# Patient Record
Sex: Female | Born: 1955 | Hispanic: No | Marital: Married | Smoking: Never smoker
Health system: Southern US, Community
[De-identification: ages and names within clinical notes are randomized; demographics above are authoritative.]

## PROBLEM LIST (undated history)

## (undated) DIAGNOSIS — E119 Type 2 diabetes mellitus without complications: Secondary | ICD-10-CM

## (undated) DIAGNOSIS — I10 Essential (primary) hypertension: Secondary | ICD-10-CM

---

## 2007-08-08 ENCOUNTER — Ambulatory Visit (HOSPITAL_COMMUNITY): Admission: RE | Admit: 2007-08-08 | Discharge: 2007-08-08 | Payer: Self-pay | Admitting: Nurse Practitioner

## 2008-04-12 ENCOUNTER — Ambulatory Visit (HOSPITAL_COMMUNITY): Admission: RE | Admit: 2008-04-12 | Discharge: 2008-04-12 | Payer: Self-pay | Admitting: Nurse Practitioner

## 2009-04-15 ENCOUNTER — Ambulatory Visit (HOSPITAL_COMMUNITY): Admission: RE | Admit: 2009-04-15 | Discharge: 2009-04-15 | Payer: Self-pay | Admitting: Family Medicine

## 2009-09-06 ENCOUNTER — Emergency Department (HOSPITAL_COMMUNITY)
Admission: EM | Admit: 2009-09-06 | Discharge: 2009-09-06 | Payer: Self-pay | Source: Home / Self Care | Admitting: Emergency Medicine

## 2010-02-23 ENCOUNTER — Encounter: Payer: Self-pay | Admitting: Nurse Practitioner

## 2011-07-24 ENCOUNTER — Other Ambulatory Visit (HOSPITAL_COMMUNITY): Payer: Self-pay | Admitting: Family Medicine

## 2011-07-24 DIAGNOSIS — Z139 Encounter for screening, unspecified: Secondary | ICD-10-CM

## 2011-07-30 ENCOUNTER — Ambulatory Visit (HOSPITAL_COMMUNITY)
Admission: RE | Admit: 2011-07-30 | Discharge: 2011-07-30 | Disposition: A | Discharge status: Home / Self Care | Payer: BC Managed Care – PPO | Source: Ambulatory Visit | Attending: Family Medicine | Admitting: Family Medicine

## 2011-07-30 DIAGNOSIS — Z1231 Encounter for screening mammogram for malignant neoplasm of breast: Secondary | ICD-10-CM | POA: Insufficient documentation

## 2011-07-30 DIAGNOSIS — Z139 Encounter for screening, unspecified: Secondary | ICD-10-CM

## 2011-07-30 DIAGNOSIS — N63 Unspecified lump in unspecified breast: Secondary | ICD-10-CM | POA: Insufficient documentation

## 2011-08-03 ENCOUNTER — Other Ambulatory Visit: Payer: Self-pay | Admitting: Family Medicine

## 2011-08-03 DIAGNOSIS — R928 Other abnormal and inconclusive findings on diagnostic imaging of breast: Secondary | ICD-10-CM

## 2011-08-19 ENCOUNTER — Ambulatory Visit (HOSPITAL_COMMUNITY)
Admission: RE | Admit: 2011-08-19 | Discharge: 2011-08-19 | Disposition: A | Discharge status: Home / Self Care | Payer: BC Managed Care – PPO | Source: Ambulatory Visit | Attending: Family Medicine | Admitting: Family Medicine

## 2011-08-19 ENCOUNTER — Other Ambulatory Visit (HOSPITAL_COMMUNITY): Payer: Self-pay | Admitting: Family Medicine

## 2011-08-19 DIAGNOSIS — R928 Other abnormal and inconclusive findings on diagnostic imaging of breast: Secondary | ICD-10-CM

## 2011-08-26 ENCOUNTER — Ambulatory Visit (HOSPITAL_COMMUNITY): Payer: BC Managed Care – PPO

## 2019-05-24 ENCOUNTER — Other Ambulatory Visit: Payer: Self-pay | Admitting: Family Medicine

## 2019-05-24 DIAGNOSIS — R4182 Altered mental status, unspecified: Secondary | ICD-10-CM

## 2019-05-25 ENCOUNTER — Emergency Department (HOSPITAL_COMMUNITY): Payer: BC Managed Care – PPO

## 2019-05-25 ENCOUNTER — Other Ambulatory Visit: Payer: Self-pay

## 2019-05-25 ENCOUNTER — Emergency Department (HOSPITAL_COMMUNITY)
Admission: EM | Admit: 2019-05-25 | Discharge: 2019-05-25 | Disposition: A | Discharge status: Home / Self Care | Payer: BC Managed Care – PPO | Attending: Emergency Medicine | Admitting: Emergency Medicine

## 2019-05-25 ENCOUNTER — Ambulatory Visit (HOSPITAL_COMMUNITY)
Admission: RE | Admit: 2019-05-25 | Discharge: 2019-05-25 | Disposition: A | Discharge status: Home / Self Care | Payer: BC Managed Care – PPO | Source: Ambulatory Visit | Attending: Family Medicine | Admitting: Family Medicine

## 2019-05-25 DIAGNOSIS — R4182 Altered mental status, unspecified: Secondary | ICD-10-CM | POA: Insufficient documentation

## 2019-05-25 DIAGNOSIS — I639 Cerebral infarction, unspecified: Secondary | ICD-10-CM | POA: Insufficient documentation

## 2019-05-25 LAB — COMPREHENSIVE METABOLIC PANEL
ALT: 16 U/L (ref 0–44)
AST: 18 U/L (ref 15–41)
Albumin: 3.9 g/dL (ref 3.5–5.0)
Alkaline Phosphatase: 110 U/L (ref 38–126)
Anion gap: 9 (ref 5–15)
BUN: 13 mg/dL (ref 8–23)
CO2: 24 mmol/L (ref 22–32)
Calcium: 9.3 mg/dL (ref 8.9–10.3)
Chloride: 105 mmol/L (ref 98–111)
Creatinine, Ser: 0.77 mg/dL (ref 0.44–1.00)
GFR calc Af Amer: >60 mL/min (ref >60)
GFR calc non Af Amer: >60 mL/min (ref >60)
Glucose, Bld: 96 mg/dL (ref 70–99)
Potassium: 3.3 mmol/L — ABNORMAL LOW (ref 3.5–5.1)
Sodium: 138 mmol/L (ref 135–145)
Total Bilirubin: 0.4 mg/dL (ref 0.3–1.2)
Total Protein: 7.7 g/dL (ref 6.5–8.1)

## 2019-05-25 LAB — DIFFERENTIAL
Abs Immature Granulocytes: 0.02 10*3/uL (ref 0.00–0.07)
Basophils Absolute: 0.1 10*3/uL (ref 0.0–0.1)
Basophils Relative: 1 %
Eosinophils Absolute: 0.3 10*3/uL (ref 0.0–0.5)
Eosinophils Relative: 3 %
Immature Granulocytes: 0 %
Lymphocytes Relative: 29 %
Lymphs Abs: 2.7 10*3/uL (ref 0.7–4.0)
Monocytes Absolute: 0.5 10*3/uL (ref 0.1–1.0)
Monocytes Relative: 6 %
Neutro Abs: 5.8 10*3/uL (ref 1.7–7.7)
Neutrophils Relative %: 61 %

## 2019-05-25 LAB — CBC
HCT: 37 % (ref 36.0–46.0)
Hemoglobin: 11.4 g/dL — ABNORMAL LOW (ref 12.0–15.0)
MCH: 26.3 pg (ref 26.0–34.0)
MCHC: 30.8 g/dL (ref 30.0–36.0)
MCV: 85.5 fL (ref 80.0–100.0)
Platelets: 395 10*3/uL (ref 150–400)
RBC: 4.33 MIL/uL (ref 3.87–5.11)
RDW: 13.3 % (ref 11.5–15.5)
WBC: 9.4 10*3/uL (ref 4.0–10.5)
nRBC: 0 % (ref 0.0–0.2)

## 2019-05-25 LAB — APTT: aPTT: 29 seconds (ref 24–36)

## 2019-05-25 LAB — ETHANOL: Alcohol, Ethyl (B): <10 mg/dL (ref <10)

## 2019-05-25 LAB — PROTIME-INR
INR: 1 (ref 0.8–1.2)
Prothrombin Time: 13.5 seconds (ref 11.4–15.2)

## 2019-05-25 MED ORDER — CLOPIDOGREL BISULFATE 75 MG PO TABS: 75.0000 mg | ORAL_TABLET | Freq: Every day | ORAL | 1 refills | Status: AC

## 2019-05-25 MED ORDER — ASPIRIN 81 MG PO CHEW: 81.0000 mg | CHEWABLE_TABLET | Freq: Every day | ORAL | 1 refills | Status: AC

## 2019-05-25 MED ORDER — IOHEXOL 350 MG/ML SOLN
75.0000 mL | Freq: Once | INTRAVENOUS | Status: AC | PRN
  Administered 2019-05-25: 75 mL via INTRAVENOUS

## 2019-05-25 NOTE — ED Notes (Signed)
Pt assisted back to room and pulling food out of pocketbook.  Pt has passed swallow screen.

## 2019-05-25 NOTE — ED Triage Notes (Signed)
Patient unable to state reason for being seen in the ER. CT states they are unaware of why patient complaint states "brought over by CT". Pt refusing to be triaged at this time.   MRI contacted and states pt had an active stroke bleed and radiologist was supposed to inform ED staff.

## 2019-05-25 NOTE — ED Provider Notes (Addendum)
[facility] EMERGENCY DEPARTMENT Provider Note   CSN: [id number] Arrival date & time: 05/25/19  1537    History Chief Complaint  Patient presents with  . Altered Mental Status    Mary Davidson is a 64 y.o. female   Presents for evaluation of altered mental status.  Patient is unsure why she is here.  Apparently had outpatient imaging, MRI earlier today which showed acute/subacute CVA.  Patient denies any pain.  She is alert to person however not place and time.  Actually eating on initial evaluation.  Level 5 Caveat- AMS  Attempted to clean collateral from patient's husband Syble Picco without availability.  His daughter is here in ED.  States patient has had change in her urine altered mental status times a week and a half.  Information also obtained from patient's husband.  States she is noncompliant with her medications at home.  Patient has not been complaining about any headache, lightheadedness, dizziness, chest pain, shortness of breath or emesis.  She had recent urinalysis was negative for infection with the last 2 days per husband.  Prior chart review from [facility] patient has a thoracic aortic aneurysm without rupture, history of prior stroke as well as anemia per [facility] chart review.  Did for hypertensive urgency on 04/26/2019. Recommendation for outpatient follow up for thoracic aortic aneurysm, the surgery at that time did not think patient needed surgical intervention.  Per chart review ascending aortic aneurysm was 4.6 x 4.4  HPI     No past medical history on file.  There are no problems to display for this patient.  History reviewed   OB History   No obstetric history on file.     No family history on file.  Social History   Tobacco Use  . Smoking status: Not on file  Substance Use Topics  . Alcohol use: Not on file  . Drug use: Not on file    Home Medications Prior to Admission medications   Medication Sig Start Date End Date Taking? Authorizing  Provider  aspirin 81 MG chewable tablet Chew 1 tablet (81 mg total) by mouth daily. 05/25/19   [place] Bohac A, PA-C  clopidogrel (PLAVIX) 75 MG tablet Take 1 tablet (75 mg total) by mouth daily. 05/25/19   Embree Brawley A, PA-C    Allergies    Patient has no allergy information on record.  Review of Systems   Review of Systems  Unable to perform ROS: Mental status change  All other systems reviewed and are negative.   Physical Exam Updated Vital Signs BP (!) 178/71   Pulse (!) 56   Resp (!) 21   Wt 96.2 kg   SpO2 100%   Physical Exam Vitals and nursing note reviewed.  Constitutional:      General: She is not in acute distress.    Appearance: She is well-developed. She is not ill-appearing, toxic-appearing or diaphoretic.  HENT:     Head: Normocephalic and atraumatic.     Nose: Nose normal.     Mouth/Throat:     Mouth: Mucous membranes are moist.  Eyes:     Extraocular Movements: Extraocular movements intact.     Pupils: Pupils are equal, round, and reactive to light.     Comments: EOM intact. No nystagmus   Neck:     Trachea: Phonation normal.     Comments: No neck stiffness or neck rigidity Cardiovascular:     Rate and Rhythm: Normal rate and regular rhythm.  Pulses: Normal pulses.          Radial pulses are 2+ on the right side and 2+ on the left side.       Dorsalis pedis pulses are 2+ on the right side and 2+ on the left side.       Posterior tibial pulses are 2+ on the right side and 2+ on the left side.     Heart sounds: Normal heart sounds.  Pulmonary:     Effort: Pulmonary effort is normal. No respiratory distress.     Breath sounds: Normal breath sounds.  Abdominal:     General: Bowel sounds are normal. There is no distension.     Palpations: Abdomen is soft.  Musculoskeletal:        General: Normal range of motion.     Cervical back: Full passive range of motion without pain, normal range of motion and neck supple.     Comments: Moves all 4  extremities without difficulty  Skin:    General: Skin is warm and dry.     Capillary Refill: Capillary refill takes less than 2 seconds.     Comments: Tactile temperature to extremities.  No pallor, edema, erythema or warmth  Neurological:     Mental Status: She is alert.     Comments: Cranial nerves II through XII grossly intact Equal handgrip Tongue midline Equal shoulder shrug Negative Romberg, heel-to-shin Negative finger-nose Alert to person , place however not time states years 2001, unsure president. No expressive aphasia, speaks clear sentences. Knows husband and daughter in room    ED Results / Procedures / Treatments   Labs (all labs ordered are listed, but only abnormal results are displayed) Labs Reviewed  CBC - Abnormal; Notable for the following components:      Result Value   Hemoglobin 11.4 (*)    All other components within normal limits  COMPREHENSIVE METABOLIC PANEL - Abnormal; Notable for the following components:   Potassium 3.3 (*)    All other components within normal limits  ETHANOL  PROTIME-INR  APTT  DIFFERENTIAL  RAPID URINE DRUG SCREEN, HOSP PERFORMED  URINALYSIS, ROUTINE W REFLEX MICROSCOPIC    EKG EKG Interpretation  Date/Time:  Thursday May 25 2019 16:21:19 EDT Ventricular Rate:  52 PR Interval:    QRS Duration: 104 QT Interval:  489 QTC Calculation: 455 R Axis:   -18 Text Interpretation: Sinus rhythm Borderline left axis deviation Baseline wander in lead(s) V5 No previous ECGs available Confirmed by Vanetta Mulders [phone]) on 05/25/2019 6:00:39 PM   Radiology CT Angio Head W or Wo Contrast  Result Date: 05/25/2019 CLINICAL DATA:  Stroke, follow-up. EXAM: CT ANGIOGRAPHY HEAD AND NECK TECHNIQUE: Multidetector CT imaging of the head and neck was performed using the standard protocol during bolus administration of intravenous contrast. Multiplanar CT image reconstructions and MIPs were obtained to evaluate the vascular anatomy.  Carotid stenosis measurements (when applicable) are obtained utilizing NASCET criteria, using the distal internal carotid diameter as the denominator. CONTRAST:  109mL OMNIPAQUE IOHEXOL 350 MG/ML SOLN COMPARISON:  Brain MRI performed earlier the same day 05/25/2019 FINDINGS: CT HEAD FINDINGS Brain: Redemonstrated 14 mm acute infarct within the left internal capsule and left lentiform nucleus. Chronic cortically based infarct again seen within the left parietal lobe with additional tiny chronic cortically based infarcts within the left frontal operculum. No evidence of acute intracranial hemorrhage. No significant mass effect. No midline shift or extra-axial fluid collection. Minimal chronic small vessel ischemic disease within the left  frontal lobe white matter is better appreciated on same-day brain MRI. Vascular: Reported below Skull: Normal. Negative for fracture or focal lesion. Sinuses: Small right ethmoid sinus osteoma. No significant mastoid effusion. Orbits: No acute abnormality. Review of the MIP images confirms the above findings CTA NECK FINDINGS Aortic arch: Standard aortic branching. Atherosclerotic plaque within the visualized aortic arch and proximal major branch vessels of the neck. Significant innominate or proximal subclavian artery stenosis. Right carotid system: CCA and ICA patent within the neck without measurable stenosis. Minimal mixed plaque at the carotid bifurcation. Left carotid system: CCA and ICA patent within the neck. Ulcerated mixed plaque within the carotid bifurcation and proximal ICA. Resultant 30-40% stenosis of the proximal ICA as compared to the more distal vessel. Vertebral arteries: The right vertebral artery is markedly developmentally diminutive, but appears patent throughout the neck. The dominant left vertebral artery is patent throughout the neck with mild atherosclerotic narrowing at its origin. Skeleton: No acute bony abnormality or aggressive osseous lesion. C5-C6  posterior disc osteophyte complex and uncovertebral hypertrophy. Other neck: No neck mass or cervical lymphadenopathy. Tiny nonspecific calcification within the right thyroid lobe. Upper chest: No consolidation within the imaged lung apices. Review of the MIP images confirms the above findings CTA HEAD FINDINGS Anterior circulation: The intracranial internal carotid arteries are patent without significant stenosis. The M1 middle cerebral arteries are patent without significant stenosis. Atherosclerotic irregularity of the M2 and more distal MCA branch vessels bilaterally. This includes multiple moderate stenoses within mid M2 branches bilaterally. The anterior cerebral arteries are patent bilaterally without significant proximal stenosis. No intracranial aneurysm is identified. Posterior circulation: Prominent atherosclerotic irregularity of the non dominant V4 right vertebral artery with sites of occlusive or near occlusive stenosis proximal to the right PICA takeoff. The V4 right vertebral artery is patent distal to the right PICA, possibly due to retrograde flow. The V4 left vertebral artery is patent with mild-to-moderate narrowing distally. The basilar artery is diminutive in the setting of predominantly fetal origin bilateral posterior cerebral arteries. Sites of up to moderate atherosclerotic narrowing within this vessel. Predominantly fetal origin posterior cerebral arteries bilaterally with diffuse atherosclerotic irregularity. There are sites of up to moderate stenosis in the bilateral P2 segments Venous sinuses: Within limitations of contrast timing, no convincing thrombus. Anatomic variants: As described Review of the MIP images confirms the above findings IMPRESSION: CT head: 1. Redemonstrated 14 mm acute infarct within the left internal capsule/lentiform nucleus. 2. Chronic cortically based infarcts within the left parietal and frontal lobes. CTA neck: 1. The right common and internal carotid arteries  are patent within the neck without measurable stenosis. Mild mixed plaque at the carotid bifurcation. 2. The left common and internal carotid arteries are patent within the neck. Ulcerated mixed plaque within the carotid bifurcation and proximal ICA. Resultant 30-40% stenosis of the proximal left ICA. 3. The right vertebral artery is developmentally diminutive but appears patent throughout the neck. 4. The dominant left vertebral artery is patent throughout the neck with mild atherosclerotic narrowing at its origin. CTA head: 1. No intracranial large vessel occlusion. 2. Sites of occlusive or near occlusive stenosis within the non dominant V4 right vertebral artery proximal to the right PICA. Flow is seen within the distal V4 segment and right PICA, possibly due to retrograde flow. 3. Mild-to-moderate atherosclerotic narrowing of the distal V4 left vertebral artery. 4. Developmentally diminutive basilar artery with sites of up to moderate stenosis. 5. Predominantly fetal origin bilateral posterior cerebral arteries with atherosclerotic irregularity  and sites of up to moderate stenosis within the P2 segments bilaterally. 6. Atherosclerotic irregularity of the M2 and more distal MCA branch vessels bilaterally. This includes sites of up to moderate stenosis within the mid M2 segments. Electronically Signed   By: Jackey Loge DO   On: 05/25/2019 19:00   CT Angio Neck W and/or Wo Contrast  Result Date: 05/25/2019 CLINICAL DATA:  Stroke, follow-up. EXAM: CT ANGIOGRAPHY HEAD AND NECK TECHNIQUE: Multidetector CT imaging of the head and neck was performed using the standard protocol during bolus administration of intravenous contrast. Multiplanar CT image reconstructions and MIPs were obtained to evaluate the vascular anatomy. Carotid stenosis measurements (when applicable) are obtained utilizing NASCET criteria, using the distal internal carotid diameter as the denominator. CONTRAST:  75mL OMNIPAQUE IOHEXOL 350 MG/ML  SOLN COMPARISON:  Brain MRI performed earlier the same day 05/25/2019 FINDINGS: CT HEAD FINDINGS Brain: Redemonstrated 14 mm acute infarct within the left internal capsule and left lentiform nucleus. Chronic cortically based infarct again seen within the left parietal lobe with additional tiny chronic cortically based infarcts within the left frontal operculum. No evidence of acute intracranial hemorrhage. No significant mass effect. No midline shift or extra-axial fluid collection. Minimal chronic small vessel ischemic disease within the left frontal lobe white matter is better appreciated on same-day brain MRI. Vascular: Reported below Skull: Normal. Negative for fracture or focal lesion. Sinuses: Small right ethmoid sinus osteoma. No significant mastoid effusion. Orbits: No acute abnormality. Review of the MIP images confirms the above findings CTA NECK FINDINGS Aortic arch: Standard aortic branching. Atherosclerotic plaque within the visualized aortic arch and proximal major branch vessels of the neck. Significant innominate or proximal subclavian artery stenosis. Right carotid system: CCA and ICA patent within the neck without measurable stenosis. Minimal mixed plaque at the carotid bifurcation. Left carotid system: CCA and ICA patent within the neck. Ulcerated mixed plaque within the carotid bifurcation and proximal ICA. Resultant 30-40% stenosis of the proximal ICA as compared to the more distal vessel. Vertebral arteries: The right vertebral artery is markedly developmentally diminutive, but appears patent throughout the neck. The dominant left vertebral artery is patent throughout the neck with mild atherosclerotic narrowing at its origin. Skeleton: No acute bony abnormality or aggressive osseous lesion. C5-C6 posterior disc osteophyte complex and uncovertebral hypertrophy. Other neck: No neck mass or cervical lymphadenopathy. Tiny nonspecific calcification within the right thyroid lobe. Upper chest: No  consolidation within the imaged lung apices. Review of the MIP images confirms the above findings CTA HEAD FINDINGS Anterior circulation: The intracranial internal carotid arteries are patent without significant stenosis. The M1 middle cerebral arteries are patent without significant stenosis. Atherosclerotic irregularity of the M2 and more distal MCA branch vessels bilaterally. This includes multiple moderate stenoses within mid M2 branches bilaterally. The anterior cerebral arteries are patent bilaterally without significant proximal stenosis. No intracranial aneurysm is identified. Posterior circulation: Prominent atherosclerotic irregularity of the non dominant V4 right vertebral artery with sites of occlusive or near occlusive stenosis proximal to the right PICA takeoff. The V4 right vertebral artery is patent distal to the right PICA, possibly due to retrograde flow. The V4 left vertebral artery is patent with mild-to-moderate narrowing distally. The basilar artery is diminutive in the setting of predominantly fetal origin bilateral posterior cerebral arteries. Sites of up to moderate atherosclerotic narrowing within this vessel. Predominantly fetal origin posterior cerebral arteries bilaterally with diffuse atherosclerotic irregularity. There are sites of up to moderate stenosis in the bilateral P2 segments Venous sinuses:  Within limitations of contrast timing, no convincing thrombus. Anatomic variants: As described Review of the MIP images confirms the above findings IMPRESSION: CT head: 1. Redemonstrated 14 mm acute infarct within the left internal capsule/lentiform nucleus. 2. Chronic cortically based infarcts within the left parietal and frontal lobes. CTA neck: 1. The right common and internal carotid arteries are patent within the neck without measurable stenosis. Mild mixed plaque at the carotid bifurcation. 2. The left common and internal carotid arteries are patent within the neck. Ulcerated mixed  plaque within the carotid bifurcation and proximal ICA. Resultant 30-40% stenosis of the proximal left ICA. 3. The right vertebral artery is developmentally diminutive but appears patent throughout the neck. 4. The dominant left vertebral artery is patent throughout the neck with mild atherosclerotic narrowing at its origin. CTA head: 1. No intracranial large vessel occlusion. 2. Sites of occlusive or near occlusive stenosis within the non dominant V4 right vertebral artery proximal to the right PICA. Flow is seen within the distal V4 segment and right PICA, possibly due to retrograde flow. 3. Mild-to-moderate atherosclerotic narrowing of the distal V4 left vertebral artery. 4. Developmentally diminutive basilar artery with sites of up to moderate stenosis. 5. Predominantly fetal origin bilateral posterior cerebral arteries with atherosclerotic irregularity and sites of up to moderate stenosis within the P2 segments bilaterally. 6. Atherosclerotic irregularity of the M2 and more distal MCA branch vessels bilaterally. This includes sites of up to moderate stenosis within the mid M2 segments. Electronically Signed   By: Jackey Loge DO   On: 05/25/2019 19:00   MR BRAIN WO CONTRAST  Result Date: 05/25/2019 CLINICAL DATA:  Altered mental status, unspecified altered mental status type. EXAM: MRI HEAD WITHOUT CONTRAST TECHNIQUE: Multiplanar, multiecho pulse sequences of the brain and surrounding structures were obtained without intravenous contrast. COMPARISON:  No pertinent prior studies available for comparison. FINDINGS: Brain: There is a 14 mm focus of restricted diffusion centered within the posterior limb of left internal capsule and left lentiform nucleus consistent with acute/early subacute infarct. This infarct may also subtly involve portions of the left thalamus. Corresponding T2/FLAIR hyperintensity at this site. No evidence of acute infarct elsewhere within the brain. There is a small chronic cortically  based infarct within the left parietal lobe. There are additional small chronic cortically based infarcts within the left frontal operculum. Minimal chronic small-vessel ischemic changes within the left frontal lobe cerebral white matter. No evidence of intracranial mass. No midline shift or extra-axial fluid collection. No chronic intracranial blood products. Cerebral volume is normal for age. Vascular: Flow voids maintained within the proximal large arterial vessels. Skull and upper cervical spine: No focal marrow lesion. Sinuses/Orbits: Visualized orbits demonstrate no acute abnormality. Mild ethmoid sinus mucosal thickening. No significant mastoid effusion. These results were called by telephone at the time of interpretation on 05/25/2019 at 3:36 pm to provider [facility] , who verbally acknowledged these results. At this time, the patient is being transported by her daughter to the [facility] emergency department. The [facility] emergency department was contacted and made aware of her pending arrival. IMPRESSION: 1. 14 mm acute/early subacute infarct involving the posterior limb of left internal capsule, left lentiform nucleus and possibly portions of the left thalamus. 2. Small chronic cortically based infarct within the left parietal lobe. 3. Additional small chronic cortically based infarcts within the left frontal operculum. 4. Mild chronic small vessel ischemic changes within the left frontal lobe white matter. Electronically Signed   By: Jackey Loge DO  On: 05/25/2019 15:39   Procedures .Critical Care Performed by: Linwood DibblesHenderly, Almando Brawley A, PA-C Authorized by: Linwood DibblesHenderly, Avamarie Crossley A, PA-C   Critical care provider statement:    Critical care time (minutes):  35   Critical care was necessary to treat or prevent imminent or life-threatening deterioration of the following conditions:  Circulatory failure   Critical care was time spent personally by me on the following activities:  Discussions with  consultants, evaluation of patient's response to treatment, examination of patient, ordering and performing treatments and interventions, ordering and review of laboratory studies, ordering and review of radiographic studies, pulse oximetry, re-evaluation of patient's condition, obtaining history from patient or surrogate and review of old charts   (including critical care time)  Medications Ordered in ED Medications  iohexol (OMNIPAQUE) 350 MG/ML injection 75 mL (75 mLs Intravenous Contrast Given 05/25/19 1800)   ED Course  I have reviewed the triage vital signs and the nursing notes.  Pertinent labs & imaging results that were available during my care of the patient were reviewed by me and considered in my medical decision making (see chart for details).  64 year old presents for evaluation of acute/sub acute CVA on outpatient MRI. Unknown last Normal.  Alert to person, place however not time.  Cranial nerves II through XII grossly intact.  Negative romberg, heel-to-shin. Patient trying to leave on my initial evaluation, states she does not want the be in the ED. Unfortunately cannot reach Epic contact.  Patients daughter now in room. Patient willing to stay for Neuro consult.  Patient assessed by attending, Dr. Deretha EmoryZackowski.  Labs and imaging personally reviewed and interpreted: CBC without leukocytosis Metabolic panel with mild hypokalemia 3.3, no additional electrolyte, renal or normality MR with acute/sub acute CVA  CONSULT with Dr. Gerilyn Pilgrimoonquah neurology.  He recommends since symptoms started 1-2 weeks ago that patient does not need admission at this time.  He recommends starting Plavix, aspirin as well as obtaining CTA head and neck and dc home.  She can follow-up in office.  Patient reassessed.  Now has her husband in room.  Has been states patient has had memory issues and has not been "acting herself" x2 weeks.  He denied patient complain of any weakness, paresthesias, headaches, CP,  SOB.    Husband and family prefer for patient as well to follow-up outpatient and not be admitted to the hospital service given her symptoms did start approximately 2 weeks ago.  Unsure if incidental finding of acute/subacute infarct on MR and altered mental status is related to something else however family prefers to follow-up outpatient neurology feels this is reasonable and this is their recommendations as well with regards to the MR findings.  Attending, Dr. Deretha EmoryZackowski has reviewed the CTA head, neck as well as MRI findings.  States patient is suitable for discharge per neurology.  Discussed findings with patient and family in room.  She has not been able to give us a urine sample.  Patient does not want to wait for this urine sample as well as family.  They state her PCP had obtained a urine sample 2 days ago which was negative.  I did discuss with family we may be missing a urinary tract infection with AG contributing to her altered mental status however family declines waiting on additional urine sample.  We will start on Plavix and aspirin per neurology's recommendation.  We will have him follow-up outpatient.  I discussed strict return precautions with patient and family.  They voiced understanding and  are agreeable for follow-up.  I did review her prior admission to [place] last month. Stressed improtance of medication compliance given her known thoracic aortic aneurysm and now stroke.  She denies any current chest pain, shortness of breath, paresthesias and continues to have a nonfocal neuro exam aside from her memory issues.  I have low suspicion for dissection, large vessel occlusion.  Attending physician Dr. Rogene Houston has personally seen and evaluated patient as well as reviewed all images.  He agrees with above treatment, plan and disposition home with close outpatient follow-up.  Her blood pressure here is 518 systolic however will allow some permissive hypertension given her MRI findings.   I did discuss with husband medication compliance in detail.  The patient has been appropriately medically screened and/or stabilized in the ED. I have low suspicion for any other emergent medical condition which would require further screening, evaluation or treatment in the ED or require inpatient management.  Patient is hemodynamically stable and in no acute distress.  Patient able to ambulate in department prior to ED.  Evaluation does not show acute pathology that would require ongoing or additional emergent interventions while in the emergency department or further inpatient treatment.  I have discussed the diagnosis with the patient and answered all questions.  Pain is been managed while in the emergency department and patient has no further complaints prior to discharge.  Patient is comfortable with plan discussed in room and is stable for discharge at this time.  I have discussed strict return precautions for returning to the emergency department.  Patient was encouraged to follow-up with PCP/specialist refer to at discharge.    MDM Rules/Calculators/A&P                       Final Clinical Impression(s) / ED Diagnoses Final diagnoses:  Altered mental status, unspecified altered mental status type  Cerebrovascular accident (CVA), unspecified mechanism ([place])    Rx / DC Orders ED Discharge Orders         Ordered    clopidogrel (PLAVIX) 75 MG tablet  Daily     05/25/19 1932    aspirin 81 MG chewable tablet  Daily     05/25/19 1932             Hernandez Losasso A, PA-C 05/25/19 2001    Jawana Reagor A, PA-C 05/25/19 2002    Fredia Sorrow, MD 05/29/19 0730

## 2019-05-25 NOTE — Discharge Instructions (Signed)
Follow-up with neurology  Return for new or worsening symptoms 

## 2019-05-25 NOTE — ED Notes (Signed)
Notified Zackowki, MD who stated he was notified by radiologist. States pt is not a candidate for TPA.   Also stated pt had ischemic stroke, not a bleed.

## 2019-05-25 NOTE — ED Provider Notes (Signed)
Medical screening examination/treatment/procedure(s) were conducted as a shared visit with non-physician practitioner(s) and myself.  I personally evaluated the patient during the encounter.  EKG Interpretation  Date/Time:  Thursday May 25 2019 16:21:19 EDT Ventricular Rate:  52 PR Interval:    QRS Duration: 104 QT Interval:  489 QTC Calculation: 455 R Axis:   -18 Text Interpretation: Sinus rhythm Borderline left axis deviation Baseline wander in lead(s) V5 No previous ECGs available Confirmed by Vanetta Mulders [phone]) on 05/25/2019 6:00:39 PM   Results for orders placed or performed during the hospital encounter of 05/25/19  Ethanol  Result Value Ref Range   Alcohol, Ethyl (B) <10 <10 mg/dL  Protime-INR  Result Value Ref Range   Prothrombin Time 13.5 11.4 - 15.2 seconds   INR 1.0 0.8 - 1.2  APTT  Result Value Ref Range   aPTT 29 24 - 36 seconds  CBC  Result Value Ref Range   WBC 9.4 4.0 - 10.5 K/uL   RBC 4.33 3.87 - 5.11 MIL/uL   Hemoglobin 11.4 (L) 12.0 - 15.0 g/dL   HCT 95.0 93.2 - 67.1 %   MCV 85.5 80.0 - 100.0 fL   MCH 26.3 26.0 - 34.0 pg   MCHC 30.8 30.0 - 36.0 g/dL   RDW 24.5 80.9 - 98.3 %   Platelets 395 150 - 400 K/uL   nRBC 0.0 0.0 - 0.2 %  Differential  Result Value Ref Range   Neutrophils Relative % 61 %   Neutro Abs 5.8 1.7 - 7.7 K/uL   Lymphocytes Relative 29 %   Lymphs Abs 2.7 0.7 - 4.0 K/uL   Monocytes Relative 6 %   Monocytes Absolute 0.5 0.1 - 1.0 K/uL   Eosinophils Relative 3 %   Eosinophils Absolute 0.3 0.0 - 0.5 K/uL   Basophils Relative 1 %   Basophils Absolute 0.1 0.0 - 0.1 K/uL   Immature Granulocytes 0 %   Abs Immature Granulocytes 0.02 0.00 - 0.07 K/uL  Comprehensive metabolic panel  Result Value Ref Range   Sodium 138 135 - 145 mmol/L   Potassium 3.3 (L) 3.5 - 5.1 mmol/L   Chloride 105 98 - 111 mmol/L   CO2 24 22 - 32 mmol/L   Glucose, Bld 96 70 - 99 mg/dL   BUN 13 8 - 23 mg/dL   Creatinine, Ser 3.82 0.44 - 1.00 mg/dL   Calcium  9.3 8.9 - 50.5 mg/dL   Total Protein 7.7 6.5 - 8.1 g/dL   Albumin 3.9 3.5 - 5.0 g/dL   AST 18 15 - 41 U/L   ALT 16 0 - 44 U/L   Alkaline Phosphatase 110 38 - 126 U/L   Total Bilirubin 0.4 0.3 - 1.2 mg/dL   GFR calc non Af Amer >60 >60 mL/min   GFR calc Af Amer >60 >60 mL/min   Anion gap 9 5 - 15   MR BRAIN WO CONTRAST  Result Date: 05/25/2019 CLINICAL DATA:  Altered mental status, unspecified altered mental status type. EXAM: MRI HEAD WITHOUT CONTRAST TECHNIQUE: Multiplanar, multiecho pulse sequences of the brain and surrounding structures were obtained without intravenous contrast. COMPARISON:  No pertinent prior studies available for comparison. FINDINGS: Brain: There is a 14 mm focus of restricted diffusion centered within the posterior limb of left internal capsule and left lentiform nucleus consistent with acute/early subacute infarct. This infarct may also subtly involve portions of the left thalamus. Corresponding T2/FLAIR hyperintensity at this site. No evidence of acute infarct elsewhere within the brain.  There is a small chronic cortically based infarct within the left parietal lobe. There are additional small chronic cortically based infarcts within the left frontal operculum. Minimal chronic small-vessel ischemic changes within the left frontal lobe cerebral white matter. No evidence of intracranial mass. No midline shift or extra-axial fluid collection. No chronic intracranial blood products. Cerebral volume is normal for age. Vascular: Flow voids maintained within the proximal large arterial vessels. Skull and upper cervical spine: No focal marrow lesion. Sinuses/Orbits: Visualized orbits demonstrate no acute abnormality. Mild ethmoid sinus mucosal thickening. No significant mastoid effusion. These results were called by telephone at the time of interpretation on 05/25/2019 at 3:36 pm to provider [facility] , who verbally acknowledged these results. At this time, the patient is being  transported by her daughter to the [facility] emergency department. The [facility] emergency department was contacted and made aware of her pending arrival. IMPRESSION: 1. 14 mm acute/early subacute infarct involving the posterior limb of left internal capsule, left lentiform nucleus and possibly portions of the left thalamus. 2. Small chronic cortically based infarct within the left parietal lobe. 3. Additional small chronic cortically based infarcts within the left frontal operculum. 4. Mild chronic small vessel ischemic changes within the left frontal lobe white matter. Electronically Signed   By: Kellie Simmering DO   On: 05/25/2019 15:39   Patient does not quite herself for about a week.  Was brought in by primary care doctor for MRI brain.  That was done as an outpatient.  The MRI showed a 14 mm acute or subacute infarct involving the posterior limb of the left internal capsule left lentiform nuclear's and possibly portions of the left thalamus.  Patient was referred from the MRI machine to the emergency department.  Patient was somewhat resistant about the not wanting to stay.  But her daughter was her ride.  So we convinced her to stay.  We discussed the situation with Dr. Merlene Laughter our on-call neurologist.  He recommended CTA head and neck and if that was without significant findings that patient could be started on aspirin and/or Plavix and could be discharged home and he will follow her up as an outpatient.  Patient initially was refusing any blood pressure readings so we do not have that at this point in time.  But if her CTAs are negative we may be to get her to do a blood pressure because it would allow her to go home.  Patient lives with her husband and daughter.   Fredia Sorrow, MD 05/25/19 [phone]

## 2019-05-25 NOTE — ED Notes (Signed)
Pt confused and insisting on leaving despite daughter in the room and trying to reason with pt.  EDP is aware of pt wanting to leave.

## 2020-07-20 ENCOUNTER — Emergency Department (HOSPITAL_COMMUNITY)
Admission: EM | Admit: 2020-07-20 | Discharge: 2020-07-20 | Disposition: A | Discharge status: Home / Self Care | Payer: BC Managed Care – PPO | Attending: Emergency Medicine | Admitting: Emergency Medicine

## 2020-07-20 ENCOUNTER — Emergency Department (HOSPITAL_COMMUNITY): Payer: BC Managed Care – PPO

## 2020-07-20 ENCOUNTER — Encounter (HOSPITAL_COMMUNITY): Payer: Self-pay | Admitting: *Deleted

## 2020-07-20 ENCOUNTER — Other Ambulatory Visit: Payer: Self-pay

## 2020-07-20 DIAGNOSIS — S8991XA Unspecified injury of right lower leg, initial encounter: Secondary | ICD-10-CM | POA: Diagnosis present

## 2020-07-20 DIAGNOSIS — E119 Type 2 diabetes mellitus without complications: Secondary | ICD-10-CM | POA: Insufficient documentation

## 2020-07-20 DIAGNOSIS — W01198A Fall on same level from slipping, tripping and stumbling with subsequent striking against other object, initial encounter: Secondary | ICD-10-CM | POA: Diagnosis not present

## 2020-07-20 DIAGNOSIS — Z23 Encounter for immunization: Secondary | ICD-10-CM | POA: Insufficient documentation

## 2020-07-20 DIAGNOSIS — S81011A Laceration without foreign body, right knee, initial encounter: Secondary | ICD-10-CM | POA: Diagnosis not present

## 2020-07-20 DIAGNOSIS — Z7982 Long term (current) use of aspirin: Secondary | ICD-10-CM | POA: Diagnosis not present

## 2020-07-20 DIAGNOSIS — Z7901 Long term (current) use of anticoagulants: Secondary | ICD-10-CM | POA: Insufficient documentation

## 2020-07-20 DIAGNOSIS — I10 Essential (primary) hypertension: Secondary | ICD-10-CM | POA: Diagnosis not present

## 2020-07-20 HISTORY — DX: Essential (primary) hypertension: I10

## 2020-07-20 HISTORY — DX: Type 2 diabetes mellitus without complications: E11.9

## 2020-07-20 MED ORDER — TETANUS-DIPHTH-ACELL PERTUSSIS 5-2.5-18.5 LF-MCG/0.5 IM SUSY
0.5000 mL | PREFILLED_SYRINGE | Freq: Once | INTRAMUSCULAR | Status: AC
  Administered 2020-07-20: 0.5 mL via INTRAMUSCULAR
  Filled 2020-07-20: qty 0.5

## 2020-07-20 MED ORDER — CEPHALEXIN 500 MG PO CAPS: 500.0000 mg | ORAL_CAPSULE | Freq: Three times a day (TID) | ORAL | 0 refills | Status: AC

## 2020-07-20 MED ORDER — BACITRACIN ZINC 500 UNIT/GM EX OINT
1.0000 "application " | TOPICAL_OINTMENT | Freq: Two times a day (BID) | CUTANEOUS | Status: DC
  Administered 2020-07-20: 1 via TOPICAL
  Filled 2020-07-20: qty 1.8

## 2020-07-20 MED ORDER — IBUPROFEN 600 MG PO TABS: 600.0000 mg | ORAL_TABLET | Freq: Four times a day (QID) | ORAL | 0 refills | Status: AC | PRN

## 2020-07-20 MED ORDER — LIDOCAINE-EPINEPHRINE (PF) 2 %-1:200000 IJ SOLN
20.0000 mL | Freq: Once | INTRAMUSCULAR | Status: AC
  Administered 2020-07-20: 20 mL via INTRADERMAL
  Filled 2020-07-20: qty 20

## 2020-07-20 NOTE — Discharge Instructions (Addendum)
Keflex 3 times daily for 7 days to prevent infection Ibuprofen for pain - 3 times daily Keep the wound clean and dry - topical antibiotics only with a sterile dressing  ER for worsening symptoms.  You have 7 stitiches in your leg - they should come out in 10-14 days.

## 2020-07-20 NOTE — ED Triage Notes (Signed)
Pt c/o bilateral knee injury, mainly right, after she fell down on cement today. Pt reports she saw a spider and it made her fall. Denies LOC, hitting her head, and use of anticoagulants.

## 2020-07-20 NOTE — ED Provider Notes (Signed)
[facility] EMERGENCY DEPARTMENT Provider Note   CSN: [id number] Arrival date & time: 07/20/20  1141     History Chief Complaint  Patient presents with   Knee Injury    Mary Davidson is a 65 y.o. female.  HPI    This patient is a 65 year old female, she has a history of diabetes, she presents after having an accidental fall when she was scared by a spider and fell forward landing on her bilateral knees.  She suffered an injury to her right knee with abrasions and a deeper laceration anteriorly.  No other injuries including arms hands or head.  No neck or back pain, this occurred just prior to arrival, symptoms are persistent, worse with palpation, not associated with any excessive bleeding, she is not up-to-date on tetanus.  Past Medical History:  Diagnosis Date   Diabetes mellitus without complication (HCC)    Hypertension     There are no problems to display for this patient.   Past Surgical History:  Procedure Laterality Date   ABDOMINAL SURGERY       OB History   No obstetric history on file.     No family history on file.  Social History   Tobacco Use   Smoking status: Never   Smokeless tobacco: Never  Vaping Use   Vaping Use: Never used  Substance Use Topics   Alcohol use: Never   Drug use: Never    Home Medications Prior to Admission medications   Medication Sig Start Date End Date Taking? Authorizing Provider  cephALEXin (KEFLEX) 500 MG capsule Take 1 capsule (500 mg total) by mouth 3 (three) times daily for 7 days. 07/20/20 07/27/20 Yes Eber Hong, MD  ibuprofen (ADVIL) 600 MG tablet Take 1 tablet (600 mg total) by mouth every 6 (six) hours as needed. 07/20/20  Yes Eber Hong, MD  aspirin 81 MG chewable tablet Chew 1 tablet (81 mg total) by mouth daily. 05/25/19   Henderly, Britni A, PA-C  clopidogrel (PLAVIX) 75 MG tablet Take 1 tablet (75 mg total) by mouth daily. 05/25/19   Henderly, Britni A, PA-C    Allergies    Patient has no known  allergies.  Review of Systems   Review of Systems  Musculoskeletal:  Negative for back pain and neck pain.  Skin:  Positive for wound.  Neurological:  Negative for weakness, numbness and headaches.   Physical Exam Updated Vital Signs BP (!) 134/119   Pulse 64   Temp 98.2 F (36.8 C) (Oral)   Resp 18   Ht 1.651 m (5\' 5" )   Wt 97.1 kg   SpO2 98%   BMI 35.61 kg/m   Physical Exam Vitals and nursing note reviewed.  Constitutional:      Appearance: She is well-developed. She is not diaphoretic.  HENT:     Head: Normocephalic and atraumatic.  Eyes:     General:        Right eye: No discharge.        Left eye: No discharge.     Conjunctiva/sclera: Conjunctivae normal.  Pulmonary:     Effort: Pulmonary effort is normal. No respiratory distress.  Musculoskeletal:     Comments: Full range of motion without any difficulty, able to straight leg raise without difficulty, laceration present as described below, left knee with minimal abrasion  Skin:    General: Skin is warm and dry.     Findings: Lesion present. No erythema or rash.     Comments: Abrasion with  laceration in the shape of an L over the anterior right knee over the patella.  Neurological:     Mental Status: She is alert.     Coordination: Coordination normal.    ED Results / Procedures / Treatments   Labs (all labs ordered are listed, but only abnormal results are displayed) Labs Reviewed - No data to display  EKG None  Radiology DG Knee Complete 4 Views Right  Result Date: 07/20/2020 CLINICAL DATA:  Fall, laceration EXAM: RIGHT KNEE - COMPLETE 4+ VIEW COMPARISON:  None. FINDINGS: Mild to moderate joint space narrowing laterally with mild spurring. Mild medial spurring. Patellofemoral joint normal. Anterior laceration Negative for fracture or joint effusion IMPRESSION: Negative for fracture. Anterior soft tissue laceration. Electronically Signed   By: Marlan Palau M.D.   On: 07/20/2020 14:07     Procedures .Marland KitchenLaceration Repair  Date/Time: 07/20/2020 2:27 PM Performed by: Eber Hong, MD Authorized by: Eber Hong, MD   Consent:    Consent obtained:  Verbal   Consent given by:  Patient   Risks discussed:  Infection, pain, need for additional repair, poor cosmetic result and poor wound healing   Alternatives discussed:  No treatment and delayed treatment Universal protocol:    Relevant documents present and verified: yes     Test results available: yes     Imaging studies available: yes     Required blood products, implants, devices, and special equipment available: yes     Site/side marked: yes     Immediately prior to procedure, a time out was called: yes     Patient identity confirmed:  Verbally with patient Anesthesia:    Anesthesia method:  Local infiltration   Local anesthetic:  Lidocaine 1% WITH epi Laceration details:    Location:  Leg   Leg location:  R knee   Length (cm):  5.5   Depth (mm):  3 Pre-procedure details:    Preparation:  Patient was prepped and draped in usual sterile fashion and imaging obtained to evaluate for foreign bodies Exploration:    Limited defect created (wound extended): no     Hemostasis achieved with:  Direct pressure   Imaging obtained: x-ray     Imaging outcome: foreign body not noted     Wound exploration: wound explored through full range of motion and entire depth of wound visualized     Wound extent: no fascia violation noted, no foreign bodies/material noted, no muscle damage noted, no nerve damage noted, no tendon damage noted, no underlying fracture noted and no vascular damage noted     Contaminated: yes (tissue extensively irrigated and cleaned with gauze)   Treatment:    Area cleansed with:  Betadine   Amount of cleaning:  Standard   Irrigation solution:  Sterile saline   Irrigation method:  Syringe   Debridement:  None   Undermining:  None Skin repair:    Repair method:  Sutures   Suture size:  4-0   Suture  material:  Prolene   Suture technique:  Simple interrupted   Number of sutures:  7 Approximation:    Approximation:  Close Repair type:    Repair type:  Intermediate Post-procedure details:    Dressing:  Antibiotic ointment and sterile dressing   Procedure completion:  Tolerated well, no immediate complications Comments:     X-rays revealed no signs of foreign bodies, the shape of the wound was L-shaped and somewhat ragged, wound edges were approximated as best as could possibly be done under  direct visualization and 7 sutures were placed with good approximation.  Bleeding controlled, patient will be placed on anti-inflammatory and an antibiotic     Medications Ordered in ED Medications  Tdap (BOOSTRIX) injection 0.5 mL (has no administration in time range)  bacitracin ointment 1 application (1 application Topical Given 07/20/20 1420)  lidocaine-EPINEPHrine (XYLOCAINE W/EPI) 2 %-1:200000 (PF) injection 20 mL (20 mLs Intradermal Given 07/20/20 1420)    ED Course  I have reviewed the triage vital signs and the nursing notes.  Pertinent labs & imaging results that were available during my care of the patient were reviewed by me and considered in my medical decision making (see chart for details).    MDM Rules/Calculators/A&P                          Patient needs primary repair of right knee laceration, wound will be cleansed after anesthetized.  Patient agreeable  Final Clinical Impression(s) / ED Diagnoses Final diagnoses:  Laceration of knee, right, initial encounter    Rx / DC Orders ED Discharge Orders          Ordered    ibuprofen (ADVIL) 600 MG tablet  Every 6 hours PRN        07/20/20 1430    cephALEXin (KEFLEX) 500 MG capsule  3 times daily        07/20/20 1430             Eber Hong, MD 07/20/20 1433

## 2021-12-31 IMAGING — DX DG KNEE COMPLETE 4+V*R*
4 series · 4 of 4 positions shown · non-contrast
Comparison: None.

CLINICAL DATA: Fall, laceration

EXAM:
RIGHT KNEE - COMPLETE 4+ VIEW

[knee ap]
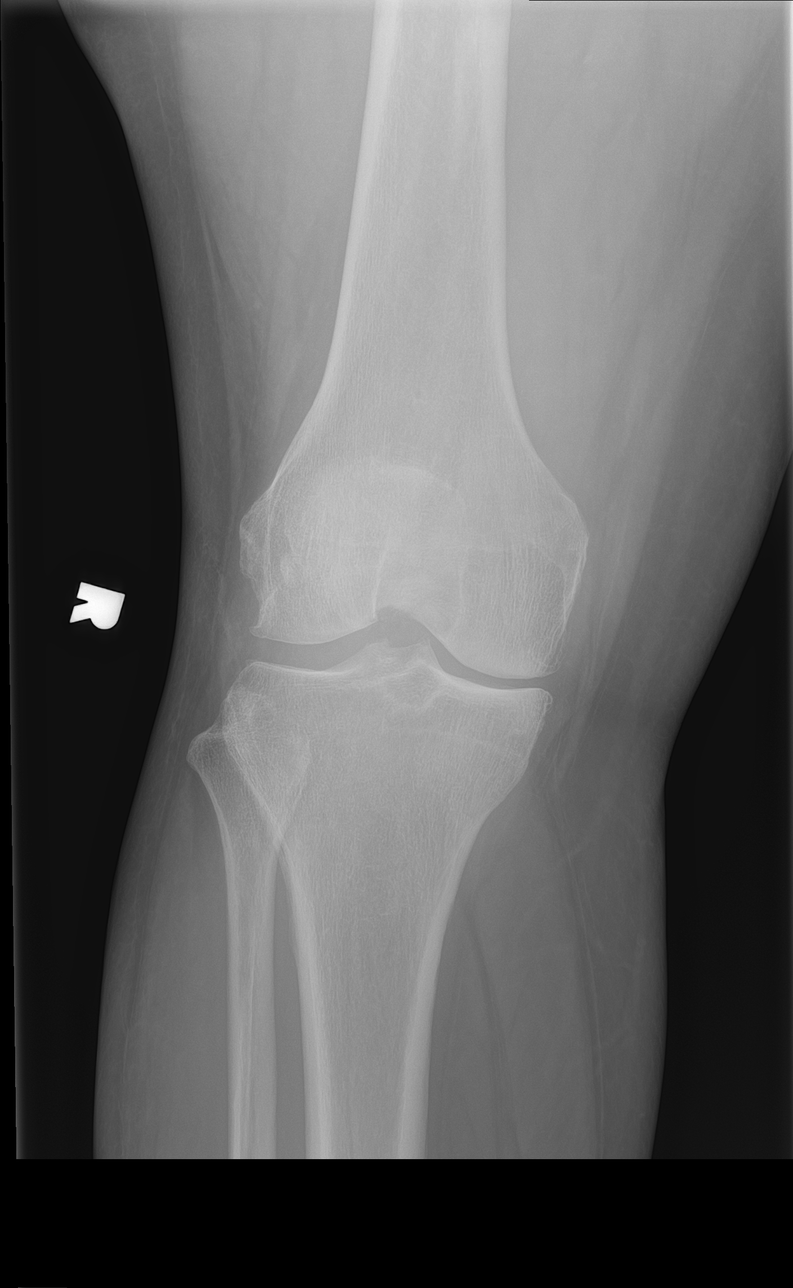

[knee lat]
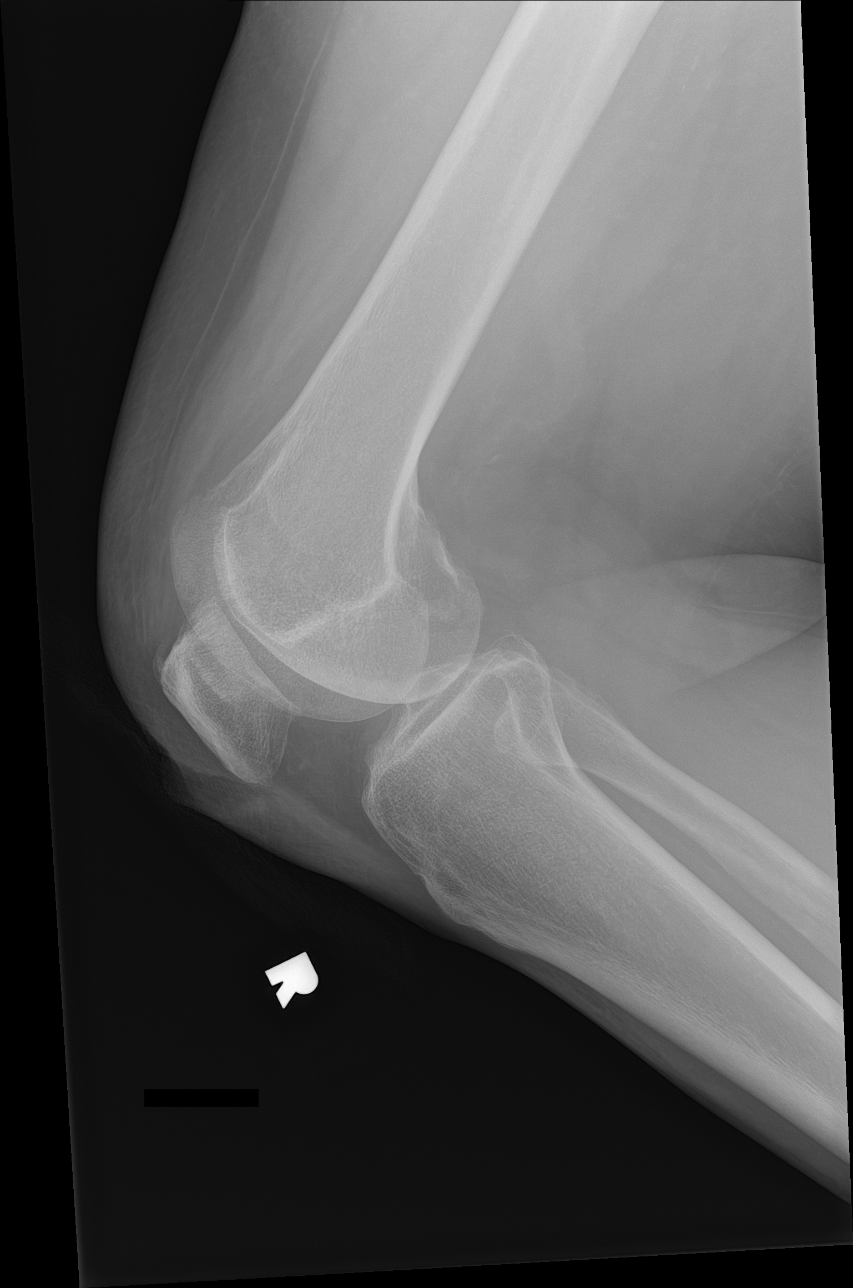

[knee obl (1 of 2)]
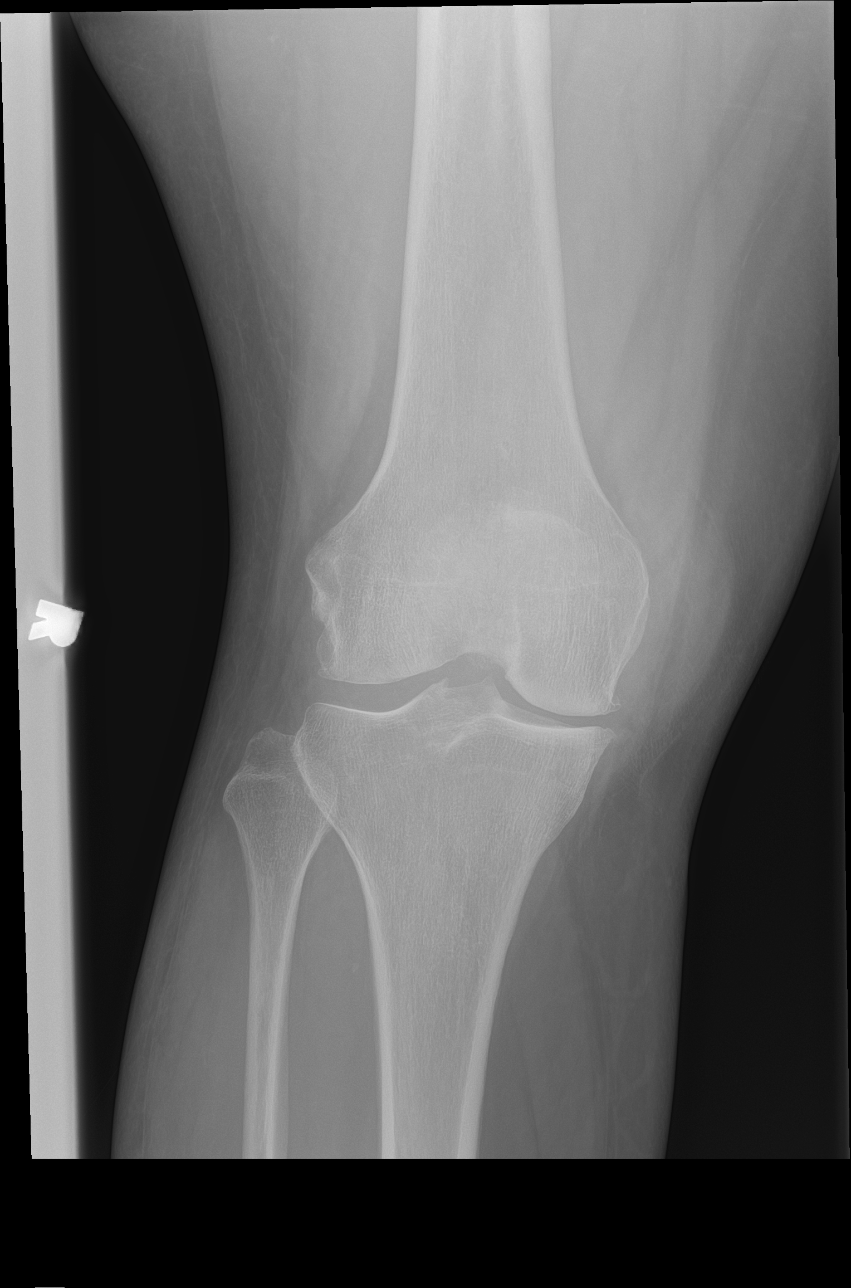

[knee obl (2 of 2)]
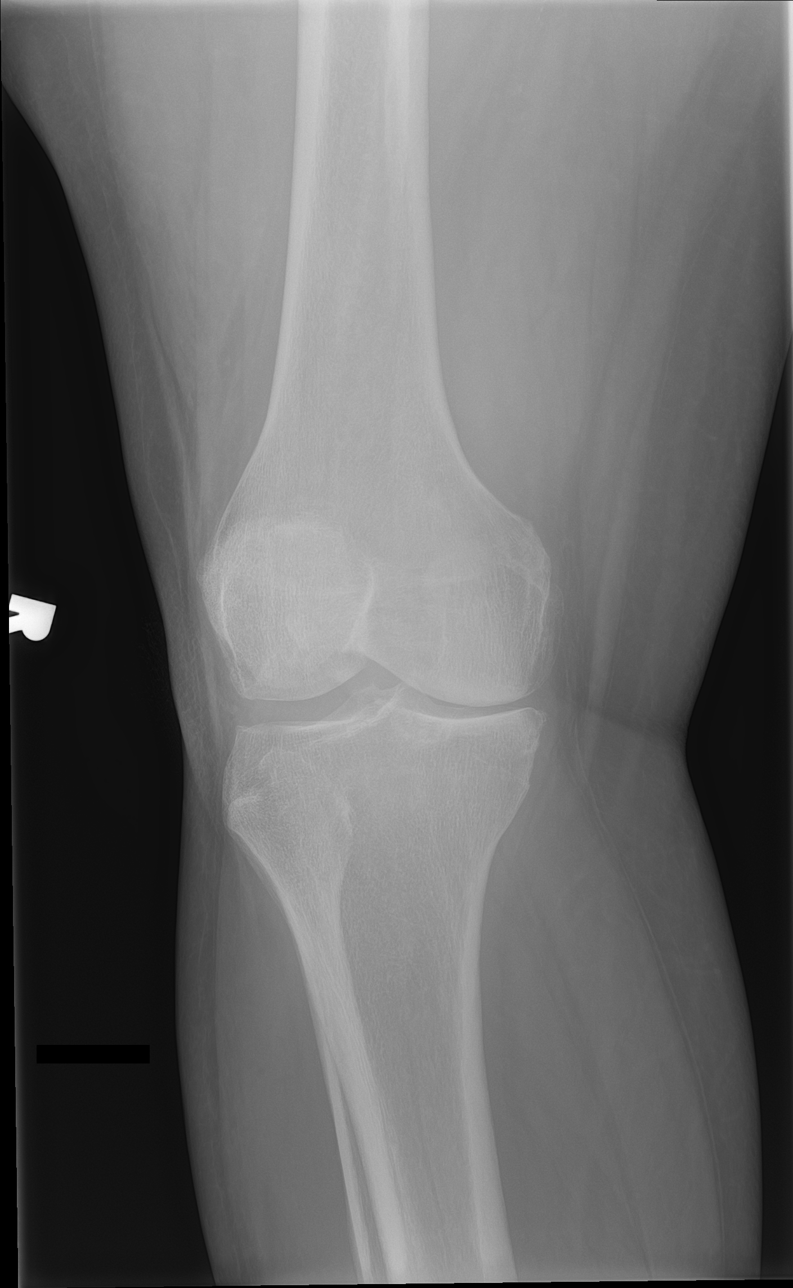

[4 of 4 positions shown; findings below may reference images not displayed]

FINDINGS: Mild to moderate joint space narrowing laterally with mild spurring.
Mild medial spurring. Patellofemoral joint normal. Anterior
laceration

Negative for fracture or joint effusion
IMPRESSION: Negative for fracture. Anterior soft tissue laceration.
